# Patient Record
Sex: Female | Born: 1980 | Race: Black or African American | Hispanic: No | Marital: Married
Health system: Southern US, Community
[De-identification: ages and names within clinical notes are randomized; demographics above are authoritative.]

## PROBLEM LIST (undated history)

## (undated) DIAGNOSIS — E785 Hyperlipidemia, unspecified: Secondary | ICD-10-CM

## (undated) DIAGNOSIS — E669 Obesity, unspecified: Secondary | ICD-10-CM

## (undated) DIAGNOSIS — I10 Essential (primary) hypertension: Secondary | ICD-10-CM

## (undated) DIAGNOSIS — K219 Gastro-esophageal reflux disease without esophagitis: Secondary | ICD-10-CM

## (undated) HISTORY — DX: Gastro-esophageal reflux disease without esophagitis: K21.9

## (undated) HISTORY — DX: Obesity, unspecified: E66.9

## (undated) HISTORY — DX: Essential (primary) hypertension: I10

## (undated) HISTORY — DX: Hyperlipidemia, unspecified: E78.5

---

## 2001-06-17 ENCOUNTER — Other Ambulatory Visit: Admission: RE | Admit: 2001-06-17 | Discharge: 2001-06-17 | Payer: Self-pay

## 2001-06-24 ENCOUNTER — Emergency Department (HOSPITAL_COMMUNITY): Admission: EM | Admit: 2001-06-24 | Discharge: 2001-06-24 | Payer: Self-pay | Admitting: Emergency Medicine

## 2001-06-24 ENCOUNTER — Encounter: Payer: Self-pay | Admitting: Emergency Medicine

## 2002-07-02 ENCOUNTER — Other Ambulatory Visit: Admission: RE | Admit: 2002-07-02 | Discharge: 2002-07-02 | Payer: Self-pay

## 2003-07-06 ENCOUNTER — Other Ambulatory Visit: Admission: RE | Admit: 2003-07-06 | Discharge: 2003-07-06 | Payer: Self-pay | Admitting: Obstetrics and Gynecology

## 2004-07-22 ENCOUNTER — Other Ambulatory Visit: Admission: RE | Admit: 2004-07-22 | Discharge: 2004-07-22 | Payer: Self-pay | Admitting: Obstetrics and Gynecology

## 2004-09-09 ENCOUNTER — Encounter: Admission: RE | Admit: 2004-09-09 | Discharge: 2004-09-09 | Payer: Self-pay | Admitting: Gastroenterology

## 2005-07-10 ENCOUNTER — Other Ambulatory Visit: Admission: RE | Admit: 2005-07-10 | Discharge: 2005-07-10 | Payer: Self-pay | Admitting: Obstetrics and Gynecology

## 2006-07-11 ENCOUNTER — Other Ambulatory Visit: Admission: RE | Admit: 2006-07-11 | Discharge: 2006-07-11 | Payer: Self-pay | Admitting: Obstetrics and Gynecology

## 2007-07-26 ENCOUNTER — Other Ambulatory Visit: Admission: RE | Admit: 2007-07-26 | Discharge: 2007-07-26 | Payer: Self-pay | Admitting: Obstetrics and Gynecology

## 2008-04-22 ENCOUNTER — Inpatient Hospital Stay (HOSPITAL_COMMUNITY): Admission: AD | Admit: 2008-04-22 | Discharge: 2008-04-25 | Payer: Self-pay | Admitting: Obstetrics and Gynecology

## 2008-04-24 ENCOUNTER — Encounter (INDEPENDENT_AMBULATORY_CARE_PROVIDER_SITE_OTHER): Payer: Self-pay | Admitting: Obstetrics and Gynecology

## 2008-09-14 ENCOUNTER — Ambulatory Visit (HOSPITAL_COMMUNITY): Admission: RE | Admit: 2008-09-14 | Discharge: 2008-09-14 | Payer: Self-pay | Admitting: Obstetrics

## 2008-11-24 ENCOUNTER — Encounter: Admission: RE | Admit: 2008-11-24 | Discharge: 2008-11-24 | Payer: Self-pay | Admitting: Obstetrics

## 2009-02-03 ENCOUNTER — Inpatient Hospital Stay (HOSPITAL_COMMUNITY): Admission: AD | Admit: 2009-02-03 | Discharge: 2009-02-05 | Payer: Self-pay | Admitting: Obstetrics

## 2009-03-18 ENCOUNTER — Inpatient Hospital Stay (HOSPITAL_COMMUNITY): Admission: AD | Admit: 2009-03-18 | Discharge: 2009-03-22 | Payer: Self-pay | Admitting: Obstetrics

## 2010-08-17 LAB — CBC
HCT: 40.3 % (ref 36.0–46.0)
Hemoglobin: 13 g/dL (ref 12.0–15.0)
Hemoglobin: 13.1 g/dL (ref 12.0–15.0)
Hemoglobin: 13.7 g/dL (ref 12.0–15.0)
MCHC: 33.9 g/dL (ref 30.0–36.0)
MCHC: 34.1 g/dL (ref 30.0–36.0)
MCV: 85.3 fL (ref 78.0–100.0)
MCV: 86.2 fL (ref 78.0–100.0)
Platelets: 230 10*3/uL (ref 150–400)
RBC: 4.42 MIL/uL (ref 3.87–5.11)
RBC: 4.5 MIL/uL (ref 3.87–5.11)
RBC: 4.72 MIL/uL (ref 3.87–5.11)
RDW: 13.6 % (ref 11.5–15.5)
RDW: 13.8 % (ref 11.5–15.5)
WBC: 11.6 10*3/uL — ABNORMAL HIGH (ref 4.0–10.5)
WBC: 9.6 10*3/uL (ref 4.0–10.5)

## 2010-08-17 LAB — URIC ACID: Uric Acid, Serum: 3.2 mg/dL (ref 2.4–7.0)

## 2010-08-17 LAB — LACTATE DEHYDROGENASE: LDH: 141 U/L (ref 94–250)

## 2010-08-17 LAB — COMPREHENSIVE METABOLIC PANEL
Albumin: 2.6 g/dL — ABNORMAL LOW (ref 3.5–5.2)
BUN: 1 mg/dL — ABNORMAL LOW (ref 6–23)
Creatinine, Ser: 0.42 mg/dL (ref 0.4–1.2)
Glucose, Bld: 83 mg/dL (ref 70–99)
Total Protein: 5.6 g/dL — ABNORMAL LOW (ref 6.0–8.3)

## 2010-08-17 LAB — RPR: RPR Ser Ql: NONREACTIVE

## 2010-08-23 LAB — TYPE AND SCREEN
ABO/RH(D): O POS
Antibody Screen: NEGATIVE

## 2010-08-23 LAB — URINE CULTURE
Colony Count: NO GROWTH
Special Requests: NEGATIVE

## 2010-08-24 LAB — CBC
HCT: 42.7 % (ref 36.0–46.0)
MCV: 84.9 fL (ref 78.0–100.0)
Platelets: 262 10*3/uL (ref 150–400)
RDW: 14.5 % (ref 11.5–15.5)
WBC: 7.3 10*3/uL (ref 4.0–10.5)

## 2010-08-24 LAB — URINALYSIS, ROUTINE W REFLEX MICROSCOPIC
Bilirubin Urine: NEGATIVE
Glucose, UA: NEGATIVE mg/dL
Hgb urine dipstick: NEGATIVE
Ketones, ur: NEGATIVE mg/dL
Nitrite: NEGATIVE
Protein, ur: NEGATIVE mg/dL
Specific Gravity, Urine: 1.01 (ref 1.005–1.030)
Urobilinogen, UA: 0.2 mg/dL (ref 0.0–1.0)
pH: 7 (ref 5.0–8.0)

## 2010-09-25 IMAGING — US US OB COMP +14 WK
1 series · 14 of 28 positions shown · non-contrast
Comparison: none

OBSTETRICAL ULTRASOUND:
 This ultrasound exam was performed in the [HOSPITAL] Ultrasound Department.  The OB US report was generated in the AS system, and faxed to the ordering physician.  This report is also available in [REDACTED] PACS.

[Series 1: us ob comp +14 wk · 0.31mm/px · 39 acquisitions, 14 frames shown]
[im 2/39]
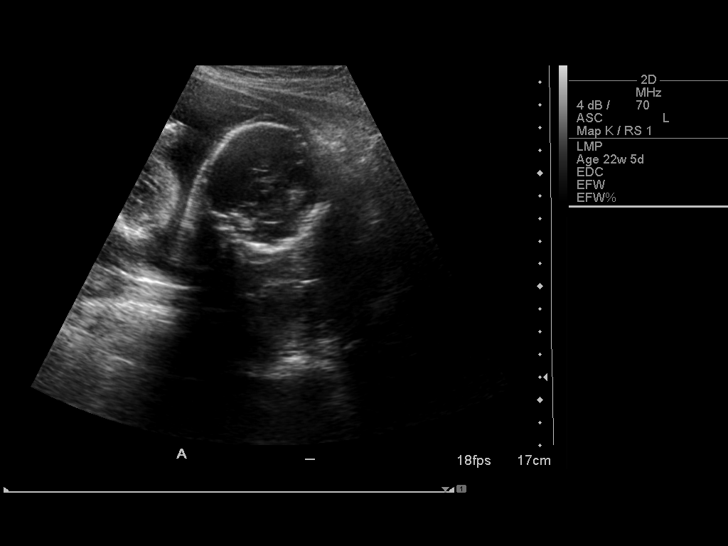
[im 5/39]
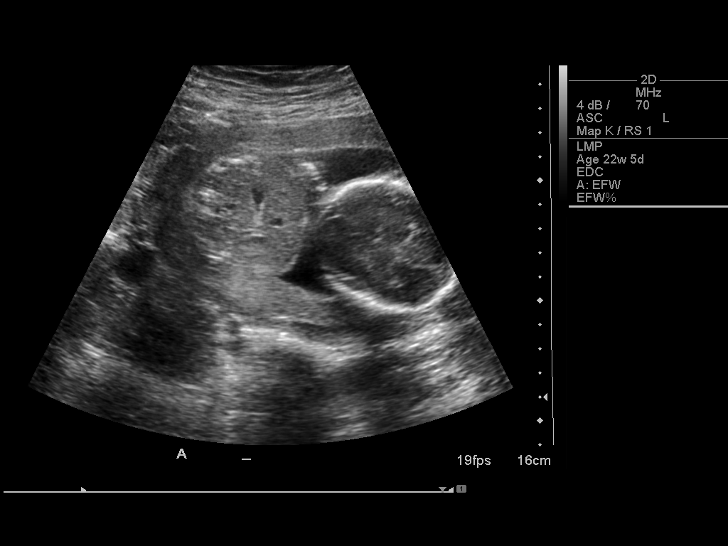
[im 8/39]
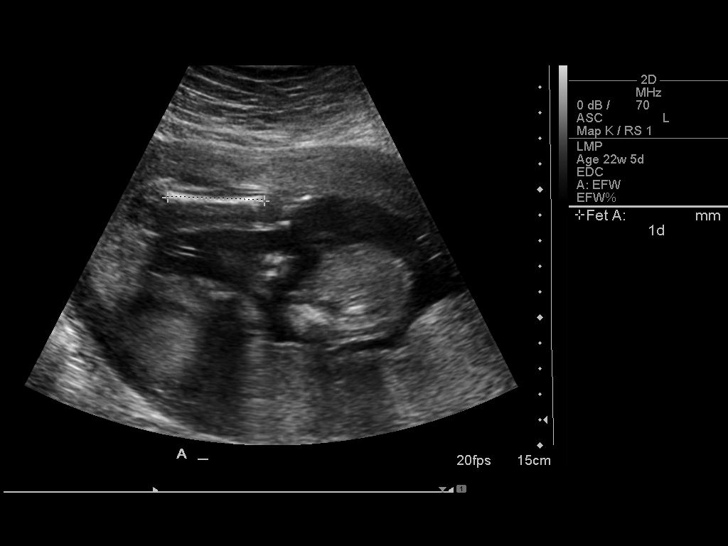
[im 10/39]
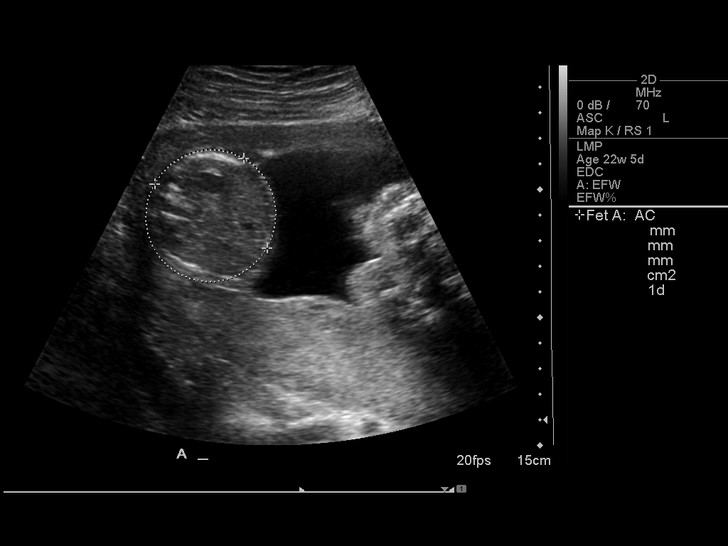
[im 13/39]
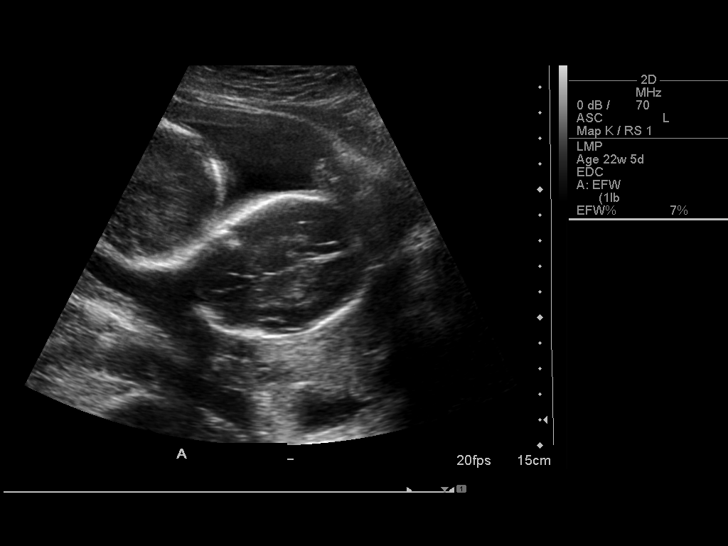
[im 16/39]
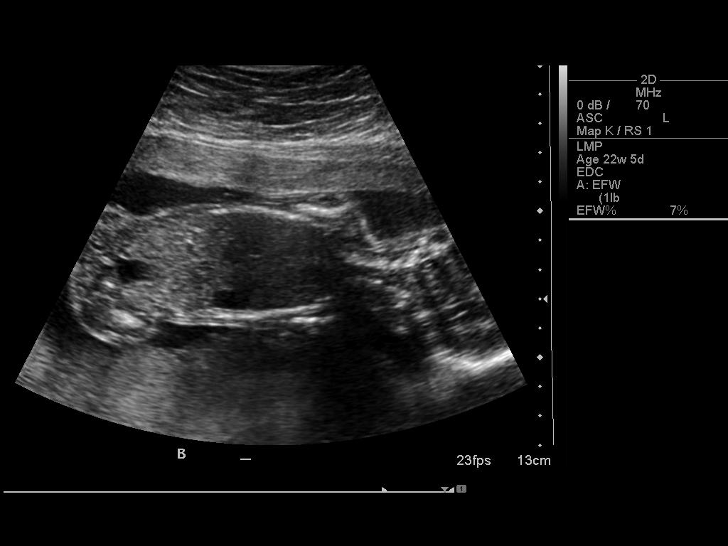
[im 19/39]
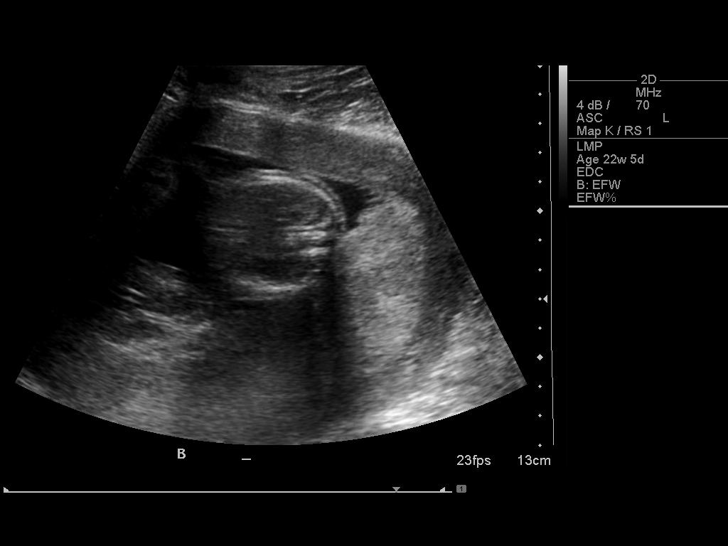
[im 22/39]
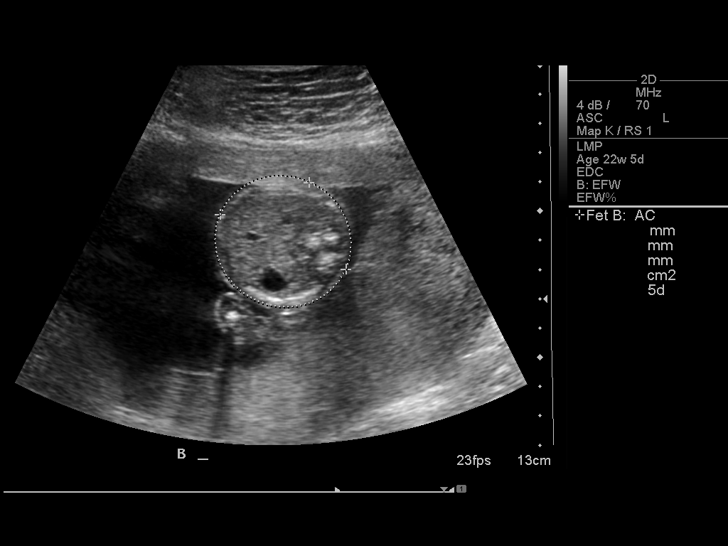
[im 24/39]
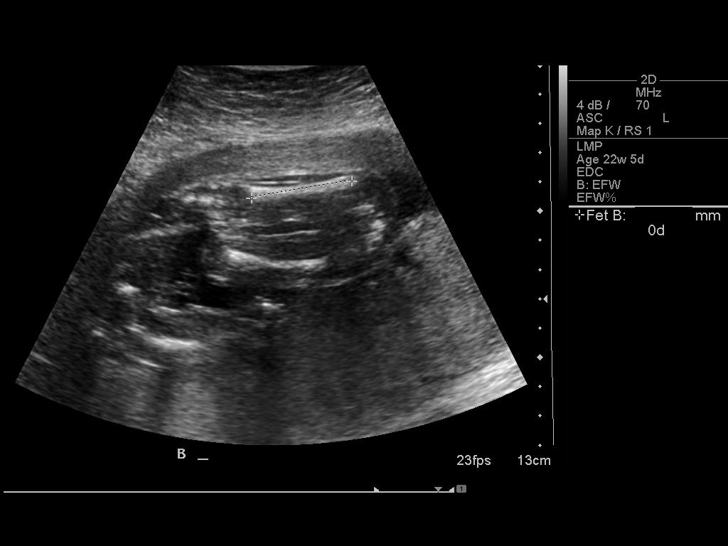
[im 27/39]
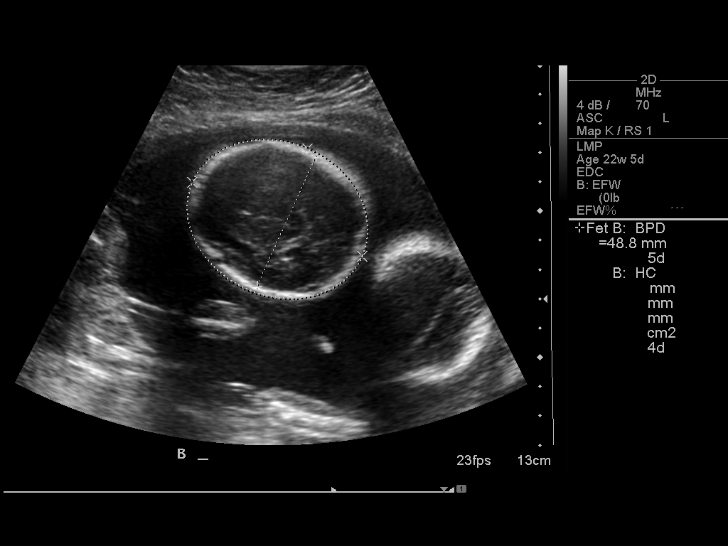
[im 30/39]
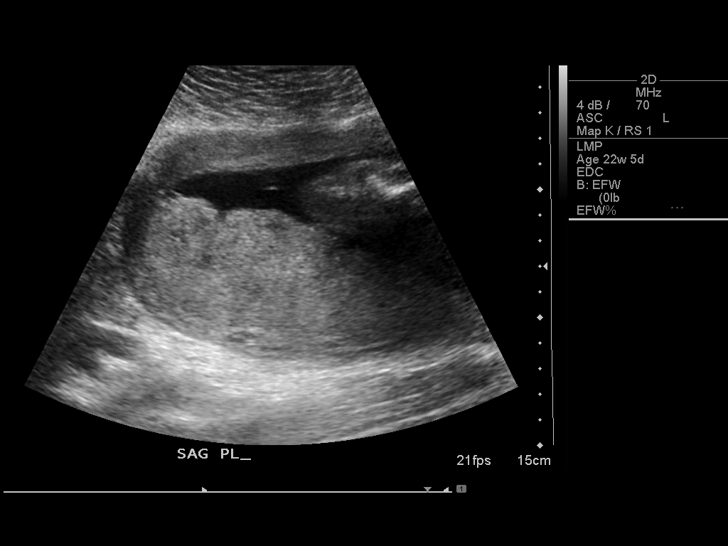
[im 33/39]
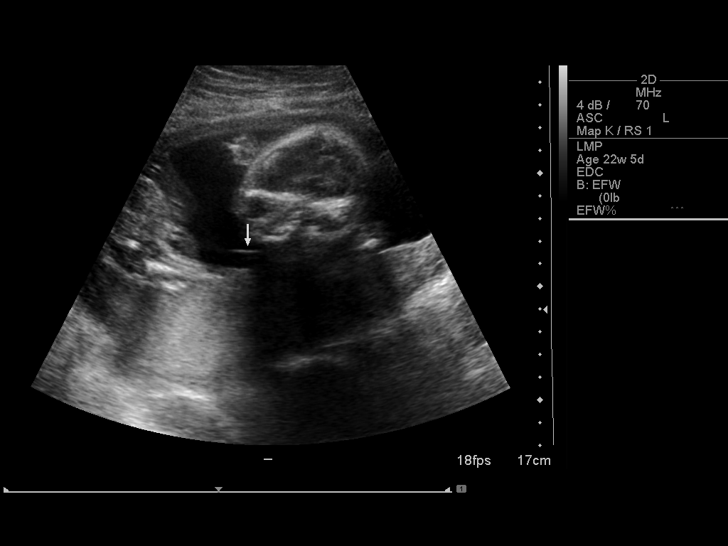
[im 36/39]
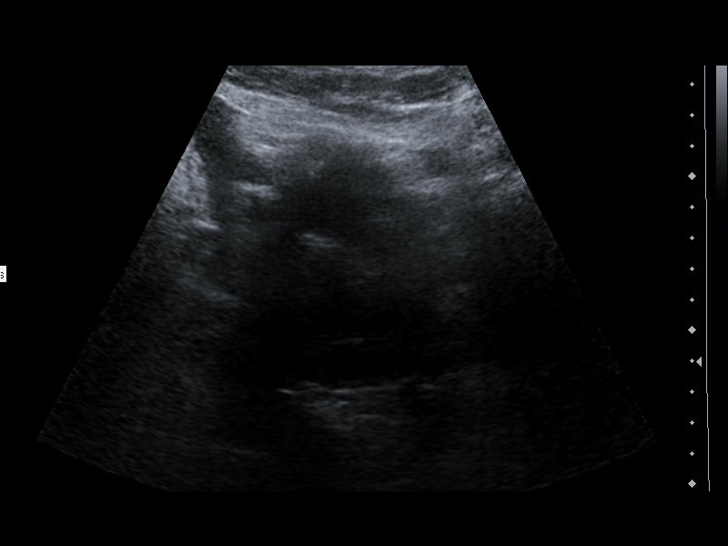
[im 39/39]
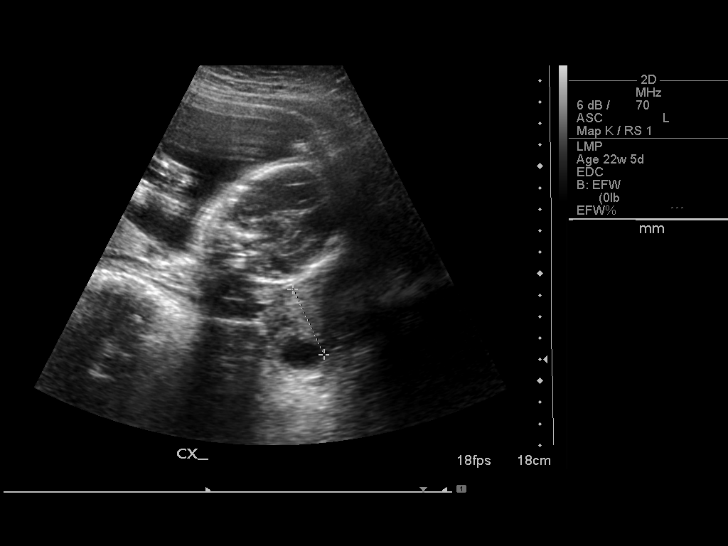

[14 of 28 positions shown; findings below may reference images not displayed]

IMPRESSION: See AS Obstetric US report.

## 2010-09-27 NOTE — Op Note (Signed)
Jacqueline Cuevas, Jacqueline Cuevas            ACCOUNT NO.:  [id number]   MEDICAL RECORD NO.:  [id number]          PATIENT TYPE:  AMB   LOCATION:  SDC                           FACILITY:  [facility]   PHYSICIAN:  Lendon Colonel, MD   DATE OF BIRTH:  04-Nov-1980   DATE OF PROCEDURE:  09/14/2008  DATE OF DISCHARGE:                               OPERATIVE REPORT   PREOPERATIVE DIAGNOSIS:  Cervical incompetence.   POSTOPERATIVE DIAGNOSIS:  Cervical incompetence.   PROCEDURE:  History indicated cervical cerclage with Mersilene.   SURGEON:  Alphonsus Sias. Ernestina Penna, MD   ASSISTANT:  None.   ANESTHESIA:  Spinal.   FINDINGS:  Long cervix preprocedure.  Good cerclage placement  postprocedure with knot at 12 o'clock, hemostasis.   SPECIMENS:  None.   ANTIBIOTICS:  None.   ESTIMATED BLOOD LOSS:  Minimal.   COMPLICATIONS:  None.   INDICATIONS:  This 30 year old, G1, P0 with history of 22-week twin  fetal demise secondary to PPROM and possible cervical incompetence.  After risks, benefits, alternatives of cerclage were discussed with the  patient, decision was made to proceed with history indicated  prophylactic cerclage.  The patient had ultrasound in the office earlier  today which noted a viable IUP without any major malformations.  Genetic  screening was deferred.  The patient was thus consented for a cerclage.   PROCEDURE:  After informed consent was obtained, the patient was taken  to the operating room where spinal anesthesia was initiated without  difficulty.  She was prepped and draped in a sterile fashion in dorsal  supine lithotomy position.  Bimanual examination was done which felt a  long cervix.  A long speculum was inserted into the vagina.  Cervix was  identified.  A long weighted speculum was then inserted after a bivalve  speculum was removed, and a Deaver was used to retract the anterior  vaginal wall.  A Mayo catgut half-inch circle taper needle was used with  a 12-inch 5-mm Mersilene  mesh tape.  The mesh tape was threaded through  the mayo needle and starting at 12 o'clock and proceeding with sutures  from 12 to 9, 9 to 6, 6 to 3, and back 3 to 12.  Four sutures were taken  in a pursestring fashion.  Great care was taken with each stitch.  A 0  Prolene was then tied down behind the tie and the Mersilene mesh suture  in order to provide retraction on the Mersilene tape for removal.  The  Mersilene stitch was placed approximately 2 cm into the cervix on the  anterior cervical wall and about 1-1/2 cm in on the  posterior.  The posterior bite was a bit more shallow given the  posterior partial previa.  The knot was tied at 12 o'clock.  Good  hemostasis was noted.  The patient tolerated the procedure well.  Sponge, lap, and needle count were correct x3, and the patient was taken  to the recovery room in stable condition.      Lendon Colonel, MD  Electronically Signed     KAF/MEDQ  D:  09/14/2008  T:  09/15/2008  Job:  [id number]

## 2010-09-27 NOTE — Discharge Summary (Signed)
Jacqueline Cuevas, Jacqueline Cuevas            ACCOUNT NO.:  [id number]   MEDICAL RECORD NO.:  [id number]          PATIENT TYPE:  INP   LOCATION:  9302                          FACILITY:  [facility]   PHYSICIAN:  Gerald Leitz, MD          DATE OF BIRTH:  October 18, 1980   DATE OF ADMISSION:  04/22/2008  DATE OF DISCHARGE:  04/25/2008                               DISCHARGE SUMMARY   ADMISSION DIAGNOSES:  1. A 22-5/7th weeks intrauterine pregnancy.  2. Twin gestation.  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  4. Preterm premature rupture of membranes.   DISCHARGE DIAGNOSES:  1. A 22-5/7th weeks intrauterine pregnancy.  2. Twin gestation.  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  4. Preterm premature rupture of membranes.  5. Status post spontaneous vaginal delivery.   BRIEF HOSPITAL COURSE:  The patient was admitted on April 25, 2008,  at 22 weeks and 5 days intrauterine pregnancy with twin-to-twin  transfusion syndrome.  She had preterm premature rupture of membranes of  twin #A on April 22, 2008 and was admitted.  She was counseled  regarding expected management versus augmentation of labor with the  delivery of 2 nonviable infant.  A NICU consult was obtained.  After  counseling the patient, decided to move towards delivery and received  Cytotec and Pitocin.  She delivered on April 24, 2008, a live born  female infant with Apgars of 1 and 1 at 1 and 5 minutes respectively.  Twin A baby was delivered at 6:12 in the morning and twin B was  delivered at 6:21 female infant with Apgars of 1 and 1 at 1 and 5  minutes respectively.  She did well postoperatively and is discharged to  home in stable condition.  Hemoglobin on postop day #1 was 10.1.   MEDICATIONS AT DISCHARGE:  Toprol, Prilosec, Xanax, Motrin, and  Phenergan.  Follow up with Dr. Noel Gerold in 4-6 weeks for postpartum visit.   CONDITION AT DISCHARGE:  Stable.      Gerald Leitz, MD  Electronically Signed     TC/MEDQ  D:  04/25/2008  T:   04/25/2008  Job:  [id number]

## 2011-02-17 LAB — CBC
HCT: 29.8 % — ABNORMAL LOW (ref 36.0–46.0)
HCT: 36.2 % (ref 36.0–46.0)
Hemoglobin: 12.6 g/dL (ref 12.0–15.0)
MCHC: 34.6 g/dL (ref 30.0–36.0)
MCV: 85.8 fL (ref 78.0–100.0)
Platelets: 210 10*3/uL (ref 150–400)
Platelets: 255 K/uL (ref 150–400)
RBC: 3.44 MIL/uL — ABNORMAL LOW (ref 3.87–5.11)
RBC: 4.22 MIL/uL (ref 3.87–5.11)
RDW: 13.1 % (ref 11.5–15.5)
WBC: 8.4 10*3/uL (ref 4.0–10.5)
WBC: 9.1 K/uL (ref 4.0–10.5)

## 2011-02-17 LAB — URINALYSIS, DIPSTICK ONLY
Ketones, ur: NEGATIVE mg/dL
Leukocytes, UA: NEGATIVE
Nitrite: NEGATIVE
Protein, ur: NEGATIVE mg/dL
Urobilinogen, UA: 0.2 mg/dL (ref 0.0–1.0)

## 2011-02-17 LAB — TYPE AND SCREEN
ABO/RH(D): O POS
Antibody Screen: NEGATIVE

## 2011-02-17 LAB — ABO/RH: ABO/RH(D): O POS

## 2011-02-17 LAB — STREP B DNA PROBE: Strep Group B Ag: POSITIVE

## 2011-02-17 LAB — GC/CHLAMYDIA PROBE AMP, GENITAL: Chlamydia, DNA Probe: NEGATIVE

## 2012-11-25 ENCOUNTER — Ambulatory Visit: Payer: Self-pay | Admitting: Dietician

## 2012-12-10 ENCOUNTER — Encounter: Payer: Self-pay | Admitting: Dietician

## 2012-12-10 ENCOUNTER — Encounter: Payer: 59 | Attending: Obstetrics | Admitting: Dietician

## 2012-12-10 VITALS — Ht 62.5 in | Wt 252.3 lb

## 2012-12-10 DIAGNOSIS — E669 Obesity, unspecified: Secondary | ICD-10-CM | POA: Insufficient documentation

## 2012-12-10 DIAGNOSIS — Z713 Dietary counseling and surveillance: Secondary | ICD-10-CM | POA: Insufficient documentation

## 2012-12-10 NOTE — Patient Instructions (Addendum)
Create some time on the weekend to plan meals for the week. Make time to eat 3 meals per day and 2 snacks if needed. Eat in proportion to MyPlate, paying attention to hunger and fullness cues. Consider finding something fun and relaxing to do.

## 2012-12-10 NOTE — Progress Notes (Signed)
  Medical Nutrition Therapy:  Appt start time: 1500 end time:  1600.  Assessment:  Primary concerns today: Interested in losing weight, and doctor said that cholesterol was elevated.   Jacqueline Cuevas works for [employer] at home and lives with husband and son. States that most family members were "chunky". Weighed 130-134 pounds in high school and college. Started gaining weight after getting married and gained substantial weight with pregnancies and unable to lose weight since then.  Has a history of PCOS.   Previously lost weight on [employer] and [place]. Currently not eating breakfast and sometimes will skip lunch because she is very busy with work and taking care of 34 year old son. Jacqueline Cuevas works from home and cares for her son at the same time. Currently going to gym in the evening about 3 x week and has been regularly exercising for a number of years. Tracks her food intake and calories on iPad. States that she is not feeling hunger at all at this time, despite eating very little most days.   Lost 10 pounds in past 6 weeks due to stress from relationship with husband. Did not eat for several days last week due to stress with husband. Was not interested in recommendations for therapists at this time.   Brought 23 year old son to appointment with seem to add stress and made it difficult to concentrate.   MEDICATIONS: see list   DIETARY INTAKE:   24-hr recall:  B ( AM): coffee with creamer, water Snk ( AM): none  L ( 12 PM): Maybe an egg or piece of fruit with water sometimes will skip Snk ( PM): none D ( 6:30-7:30PM): caprese salad with grilled chicken with water or diet pepsi Snk ( PM): sometimes ice cream, twizzlers, diet pepsi Beverages: water mostly  Usual physical activity: goes to [employer] 3 x week and doing cardio, Zumba, or yoga for one hour  Estimated energy needs: 1800 calories 200 g carbohydrates 135 g protein 50 g fat  Progress Towards Goal(s):  In  progress.   Nutritional Diagnosis:  NI-1.4 Inadequate energy intake As related to eating only one meal per day.  As evidenced by diet recall.    Intervention:  Nutrition counseling provided. Emphasized the importance of eating enough food, 3 meals per day and 2 snacks, to prevent undue stress which inhibits weight loss. Suggested finding time to plan and shop for food on the weekend so that she can easily grab food for herself during the day. Also suggested making time to eat during the day and consider finding someone to babysit once in a while so she can do something relaxing. Recommended seeking the help of a therapist to deal with stress.   Plan: Create some time on the weekend to plan meals for the week.  Make time to eat 3 meals per day and 2 snacks if needed.  Eat in proportion to MyPlate, paying attention to hunger and fullness cues.  Consider finding something fun and relaxing to do.   Handouts given during visit include:  MyPlate  Snack Handout  Monitoring/Evaluation:  Dietary intake, exercise, and body weight in 4 week(s).

## 2013-01-06 ENCOUNTER — Encounter: Payer: 59 | Attending: Obstetrics | Admitting: Dietician

## 2013-01-06 VITALS — Ht 62.5 in | Wt 249.4 lb

## 2013-01-06 DIAGNOSIS — E669 Obesity, unspecified: Secondary | ICD-10-CM

## 2013-01-06 DIAGNOSIS — Z713 Dietary counseling and surveillance: Secondary | ICD-10-CM | POA: Insufficient documentation

## 2013-01-06 NOTE — Patient Instructions (Addendum)
Continue to plan healthy meals throughout the week. Continue eating 3 meals per day and 2-3 snacks if needed. Continue to eat in proportion to MyPlate, paying attention to hunger and fullness cues. Continue finding fun and relaxing things to do. Continue adding more physical activity into your week if possible.  Consider planning some small food splurges (like ice cream) every once in a while.Marland KitchenMarland Kitchen

## 2013-01-06 NOTE — Progress Notes (Signed)
  Medical Nutrition Therapy:  Appt start time: 1500 end time:  1530.  Assessment:  Primary concerns today: Jacqueline Cuevas is here today for a follow up for weight loss.  She has lost about 3 pounds in the past 4 weeks and has made a lot of positive changes to her eating habits and lifestyles. Reports that she is no longer skipping meals, added snacks into her day, and stopped buying [employer] to eat at night. States that she is feeling hunger again now that she is eating regularly. States that she is trying to reduce stress by hanging out with friends and went to the beach for a weekend.    Wt Readings from Last 3 Encounters:  01/06/13 249 lb 6.4 oz (113.127 kg)  12/10/12 252 lb 4.8 oz (114.443 kg)   Ht Readings from Last 3 Encounters:  01/06/13 5' 2.5" (1.588 m)  12/10/12 5' 2.5" (1.588 m)   Body mass index is 44.86 kg/(m^2). @BMIFA @ Normalized weight-for-age data available only for age 70 to 20 years. Normalized stature-for-age data available only for age 70 to 20 years.   MEDICATIONS: see list   DIETARY INTAKE:   24-hr recall:  B ( AM): hard boiled egg and half bagel or omelette with spinach coffee with creamer, water Snk ( AM): none  L ( 12 PM): lean meat and greens with water Snk ( PM): 15 g CHO cheeto snack pack D ( 6:30-7:30PM): whole grain pasta or rice, greens, salad with grilled chicken with water Snk ( PM): sometimes ice cream, twizzlers, diet pepsi Beverages: water mostly, sometimes diet pepsi  Usual physical activity: goes to [employer] 3 x week and doing cardio, Zumba, or yoga for one hour and walking at least 30 minutes 4 x week  Estimated energy needs: 1800 calories 200 g carbohydrates 135 g protein 50 g fat  Progress Towards Goal(s):  Some progress.   Nutritional Diagnosis:  NI-1.4 Inadequate energy intake As related to eating only one meal per day.  As evidenced by diet recall.    Intervention:  Nutrition counseling provided. Encouraged her to continue with  all of the positive changes she has made. Advised her to plan for and allow for small splurges to avoid letting foods like ice cream have too much power over her.   Plan: Continue to plan healthy meals throughout the week. Continue eating 3 meals per day and 2-3 snacks if needed. Continue to eat in proportion to MyPlate, paying attention to hunger and fullness cues. Continue finding fun and relaxing things to do. Continue adding more physical activity into your week if possible.  Consider planning some small food splurges (like ice cream) every once in a while...   Monitoring/Evaluation:  Dietary intake, exercise, and body weight in 2 month(s).

## 2013-03-10 ENCOUNTER — Ambulatory Visit: Payer: 59 | Admitting: *Deleted

## 2013-03-18 ENCOUNTER — Encounter: Payer: 59 | Attending: Obstetrics | Admitting: *Deleted

## 2013-03-18 VITALS — Ht 62.5 in | Wt 251.0 lb

## 2013-03-18 DIAGNOSIS — Z713 Dietary counseling and surveillance: Secondary | ICD-10-CM | POA: Insufficient documentation

## 2013-03-18 DIAGNOSIS — E669 Obesity, unspecified: Secondary | ICD-10-CM

## 2013-03-18 NOTE — Progress Notes (Signed)
  Medical Nutrition Therapy:  Appt start time: 1115 end time:  1145.  Assessment:  Primary concerns today: Jacqueline Cuevas is here today for a follow up for weight loss.  She admits to skipping breakfast.  Her appetite is poor due to allergies. She has not been as physically active either  Wt Readings from Last 3 Encounters:  03/18/13 251 lb (113.853 kg)  01/06/13 249 lb 6.4 oz (113.127 kg)  12/10/12 252 lb 4.8 oz (114.443 kg)   Ht Readings from Last 3 Encounters:  03/18/13 5' 2.5" (1.588 m)  01/06/13 5' 2.5" (1.588 m)  12/10/12 5' 2.5" (1.588 m)   Body mass index is 45.15 kg/(m^2). @BMIFA @ Normalized weight-for-age data available only for age 40 to 20 years. Normalized stature-for-age data available only for age 40 to 20 years.   MEDICATIONS: see list   DIETARY INTAKE:  24-hr recall:  B ( AM): 1/2 muffin and fruits.  Sometimes egg and fruit Snk ( AM): none  L ( 12 PM): tuna and whole wheat bread with a pickle Snk ( PM): fiber one bar D ( 6:30-7:30PM): pasta with meat or chicken and broccoli or cauliflour Snk ( PM): not usually Beverages: water mostly, 1 cup coffee.  Diet pepsi  Usual physical activity: was not able to be as physically active this month.works out 2 times a week.  She wants to get up in her workouts.   Estimated energy needs: 1800 calories 200 g carbohydrates 135 g protein 50 g fat  Progress Towards Goal(s):  Some progress.   Nutritional Diagnosis:  NI-1.4 Inadequate energy intake As related to eating only one meal per day.  As evidenced by diet recall.    Intervention:  Nutrition counseling provided. Encouraged her to continue with all of the positive changes she has made. Reminded her to eat 3 meals a day and to avoid meal skipping.  Suggested [employer] if she has no appetite for food.  Plan: Continue to plan healthy meals throughout the week. Continue eating 3 meals per day and 2-3 snacks if needed. Continue to eat in proportion to  MyPlate, paying attention to hunger and fullness cues. Continue finding fun and relaxing things to do. Continue adding more physical activity into your week if possible.  Consider planning some small food splurges (like ice cream) every once in a while...   Monitoring/Evaluation:  Dietary intake, exercise, and body weight in 1 month(s).

## 2013-03-18 NOTE — Patient Instructions (Signed)
Try carnation breakfast essentials mixed with almond milk for breakfast

## 2013-04-16 ENCOUNTER — Ambulatory Visit: Payer: 59 | Admitting: Dietician

## 2013-05-28 ENCOUNTER — Ambulatory Visit: Payer: 59 | Admitting: Dietician

## 2013-05-30 ENCOUNTER — Encounter: Payer: 59 | Attending: Obstetrics | Admitting: Dietician

## 2013-05-30 VITALS — Ht 62.5 in | Wt 244.5 lb

## 2013-05-30 DIAGNOSIS — Z6841 Body Mass Index (BMI) 40.0 and over, adult: Secondary | ICD-10-CM | POA: Insufficient documentation

## 2013-05-30 DIAGNOSIS — E669 Obesity, unspecified: Secondary | ICD-10-CM | POA: Insufficient documentation

## 2013-05-30 DIAGNOSIS — Z713 Dietary counseling and surveillance: Secondary | ICD-10-CM | POA: Insufficient documentation

## 2013-05-30 NOTE — Progress Notes (Signed)
  Medical Nutrition Therapy:  Appt start time: 1030 end time:  1045.  Assessment:  Primary concerns today: Jacqueline Cuevas is here today for a follow up for weight loss. Lost 7 lbs since her last visit. Tried [employer] Instant Breakfast and did not like it. Started going back to the gym (missed a few weeks d/t the holiday). Eating breakfast everyday.   Wt Readings from Last 3 Encounters:  05/30/13 244 lb 8 oz (110.904 kg)  03/18/13 251 lb (113.853 kg)  01/06/13 249 lb 6.4 oz (113.127 kg)   Ht Readings from Last 3 Encounters:  05/30/13 5' 2.5" (1.588 m)  03/18/13 5' 2.5" (1.588 m)  01/06/13 5' 2.5" (1.588 m)   Body mass index is 43.98 kg/(m^2). @BMIFA @ Normalized weight-for-age data available only for age 65 to 20 years. Normalized stature-for-age data available only for age 65 to 20 years.  MEDICATIONS: see list   DIETARY INTAKE:  24-hr recall:  B ( AM): bagel with egg and pineapple Snk ( AM): none  L ( 12 PM): broccoli, salad, light cheese, pasta Snk ( PM): none D (6:30-7:30PM): pizza, baked potato with brocolli  Snk ( PM): not usually, but had a king size snickers Beverages: water mostly, 1 cup coffee.  Diet pepsi  Usual physical activity: going to the gym 2-3 x week for 45-60 minutes  Estimated energy needs: 1800 calories 200 g carbohydrates 135 g protein 50 g fat  Progress Towards Goal(s):  Some progress.   Nutritional Diagnosis:  NI-1.4 Inadequate energy intake As related to eating only one meal per day.  As evidenced by diet recall.    Intervention:  Nutrition counseling provided. Encouraged her to continue with all of the positive changes she has made.  Plan: Continue to plan healthy meals throughout the week. Continue eating 3 meals per day and 2-3 snacks if needed. Continue to eat in proportion to MyPlate, paying attention to hunger and fullness cues. Continue finding fun and relaxing things to do. (Yoga!) Continue adding more physical activity into your week if  possible.  Consider planning some small food splurges (like ice cream) every once in a while...   Monitoring/Evaluation:  Dietary intake, exercise, and body weight in 3 month(s).

## 2013-05-30 NOTE — Patient Instructions (Addendum)
Continue to plan healthy meals throughout the week. Continue eating 3 meals per day and 2-3 snacks if needed. Continue to eat in proportion to MyPlate, paying attention to hunger and fullness cues. Continue finding fun and relaxing things to do. (Yoga!) Continue adding more physical activity into your week if possible.  Consider planning some small food splurges (like ice cream) every once in a while...   

## 2013-08-25 ENCOUNTER — Ambulatory Visit: Payer: 59 | Admitting: Dietician

## 2013-10-08 ENCOUNTER — Ambulatory Visit: Payer: 59 | Admitting: Dietician

## 2013-10-22 ENCOUNTER — Encounter: Payer: 59 | Attending: Obstetrics | Admitting: Dietician

## 2013-10-22 VITALS — Ht 62.5 in | Wt 245.0 lb

## 2013-10-22 DIAGNOSIS — E669 Obesity, unspecified: Secondary | ICD-10-CM | POA: Insufficient documentation

## 2013-10-22 DIAGNOSIS — Z713 Dietary counseling and surveillance: Secondary | ICD-10-CM | POA: Insufficient documentation

## 2013-10-22 NOTE — Progress Notes (Signed)
  Medical Nutrition Therapy:  Appt start time: 1045 end time:  1100.  Assessment:  Primary concerns today: Jacqueline Cuevas is here today for a follow up for weight loss. Weight is stable since last visit. States that food and exercise is no different than last time.   Wt Readings from Last 3 Encounters:  10/22/13 245 lb (111.131 kg)  05/30/13 244 lb 8 oz (110.904 kg)  03/18/13 251 lb (113.853 kg)   Ht Readings from Last 3 Encounters:  10/22/13 5' 2.5" (1.588 m)  05/30/13 5' 2.5" (1.588 m)  03/18/13 5' 2.5" (1.588 m)   Body mass index is 44.07 kg/(m^2). @BMIFA @ Normalized weight-for-age data available only for age 44 to 20 years. Normalized stature-for-age data available only for age 44 to 20 years.   MEDICATIONS: see list   DIETARY INTAKE:  24-hr recall:  B ( AM): bagel with egg and pineapple Snk ( AM): none  L ( 12 PM): broccoli, salad, light cheese, pasta Snk ( PM): none D (6:30-7:30PM): pizza, baked potato with brocolli  Snk ( PM): not usually, but had a king size snickers Beverages: water mostly, 1 cup coffee.  Diet pepsi  Usual physical activity: going to the gym 2-3 x week for 45-60 minutes  Estimated energy needs: 1800 calories 200 g carbohydrates 135 g protein 50 g fat  Progress Towards Goal(s):  Some progress.   Nutritional Diagnosis:  NI-1.4 Inadequate energy intake As related to eating only one meal per day.  As evidenced by diet recall.    Intervention:  Nutrition counseling provided. Encouraged her to continue with all of the positive changes she has made.  Plan: Continue to plan healthy meals throughout the week. Continue eating 3 meals per day and 2-3 snacks if needed. Continue to eat in proportion to MyPlate, paying attention to hunger and fullness cues. Continue finding fun and relaxing things to do. (Yoga!) Continue adding more physical activity into your week if possible.  Consider planning some small food splurges (like ice cream) every once in a  while...   Monitoring/Evaluation:  Dietary intake, exercise, and body weight in 2 month(s).

## 2013-10-22 NOTE — Patient Instructions (Signed)
Continue to plan healthy meals throughout the week. Continue eating 3 meals per day and 2-3 snacks if needed. Continue to eat in proportion to MyPlate, paying attention to hunger and fullness cues. Continue finding fun and relaxing things to do. (Yoga!) Continue adding more physical activity into your week if possible.  Consider planning some small food splurges (like ice cream) every once in a while.Marland KitchenMarland Kitchen

## 2013-12-24 ENCOUNTER — Ambulatory Visit: Payer: 59 | Admitting: Dietician

## 2024-05-22 ENCOUNTER — Other Ambulatory Visit: Payer: Self-pay

## 2024-05-22 ENCOUNTER — Encounter: Payer: Self-pay | Admitting: Allergy

## 2024-05-22 ENCOUNTER — Ambulatory Visit: Admitting: Allergy

## 2024-05-22 VITALS — BP 136/78 | HR 82 | Temp 97.2°F | Ht 61.81 in | Wt 229.9 lb

## 2024-05-22 DIAGNOSIS — L2089 Other atopic dermatitis: Secondary | ICD-10-CM | POA: Diagnosis not present

## 2024-05-22 DIAGNOSIS — H1013 Acute atopic conjunctivitis, bilateral: Secondary | ICD-10-CM | POA: Diagnosis not present

## 2024-05-22 DIAGNOSIS — H109 Unspecified conjunctivitis: Secondary | ICD-10-CM

## 2024-05-22 DIAGNOSIS — J0181 Other acute recurrent sinusitis: Secondary | ICD-10-CM | POA: Diagnosis not present

## 2024-05-22 DIAGNOSIS — J310 Chronic rhinitis: Secondary | ICD-10-CM

## 2024-05-22 MED ORDER — IPRATROPIUM BROMIDE 0.06 % NA SOLN: NASAL | 3 refills | Status: AC

## 2024-05-22 MED ORDER — MONTELUKAST SODIUM 10 MG PO TABS: 10.0000 mg | ORAL_TABLET | Freq: Every day | ORAL | 3 refills | Status: AC

## 2024-05-22 MED ORDER — CARBINOXAMINE MALEATE 4 MG PO TABS: 8.0000 mg | ORAL_TABLET | Freq: Two times a day (BID) | ORAL | 3 refills | Status: AC | PRN

## 2024-05-22 NOTE — Progress Notes (Signed)
 "   New Patient Note  RE: Jacqueline Cuevas MRN: [id number] DOB: 1981-02-07 Date of Office Visit: 05/22/2024  Primary care provider: Chrystal Lamarr GORMAN, MD  Chief Complaint: allergies  History of present illness: Jacqueline Cuevas is a 44 y.o. female presenting today for evaluation of allergic rhinitis. Discussed the use of AI scribe software for clinical note transcription with the patient, who gave verbal consent to proceed.  She has been experiencing persistent allergy symptoms since March, with year-round presence and exacerbations during ragweed season. Symptoms include itchy, watery eyes, stuffy and runny nose, occasional coughing, sneezing, ear itch, ear pain, and aching gums. She also experiences sinus infections approximately three to four times a year, typically resolved with a single course of antibiotics.  Her current management includes Singulair  taken in the morning and azelastine nasal spray once daily. She also uses Benadryl and Mucinex when symptoms worsen. Over-the-counter antihistamines like Claritin, Zyrtec, Allegra, and Xyzal have been ineffective, often requiring multiple doses for minimal relief.  Her symptoms significantly impact her quality of life, causing fatigue and a general feeling of being unwell. She experiences mucus production, described as 'throwing up all of this mucus,' and notes 'slick' bowel movements. She also mentions dark circles under her eyes and red spots on her face.  Her family history includes relatives who have benefited from allergy shots.   She has a history of eczema, which flares up occasionally and is managed with cortisone cream.   Review of systems: 10pt ROS negative unless noted above in HPI  Past medical history: Past Medical History:  Diagnosis Date   GERD (gastroesophageal reflux disease)    Hyperlipidemia    Hypertension    Obesity     Past surgical history: Past Surgical History:  Procedure Laterality Date    CESAREAN SECTION      Family history:  Family History  Problem Relation Age of Onset   Allergic rhinitis Father    Hypertension Other     Social history: Lives in a condo with carpeting with electric heating and central cooling.  Dog and cat in the home.  No concern for water damage, mildew or roaches in the home.  She is a clinical cytogeneticist.  Denies smoking history.   Medication List: Current Outpatient Medications  Medication Sig Dispense Refill   ALPRAZolam (XANAX) 0.5 MG tablet Take 0.5 mg by mouth at bedtime as needed for sleep.     azelastine (ASTELIN) 0.1 % nasal spray Place 1 spray into both nostrils 2 (two) times daily.     Carbinoxamine  Maleate 4 MG TABS Take 2 tablets (8 mg total) by mouth 2 (two) times daily as needed. 120 tablet 3   esomeprazole (NEXIUM) 40 MG capsule Take 40 mg by mouth daily.     ipratropium (ATROVENT ) 0.06 % nasal spray 2 sprays each nostril up to 3-4 times a day as needed for nasal drainage/congestion. 15 mL 3   levonorgestrel (MIRENA, 52 MG,) 20 MCG/DAY IUD 1 each by Intrauterine route once.     metoprolol succinate (TOPROL-XL) 100 MG 24 hr tablet Take 100 mg by mouth daily. Take with or immediately following a meal.     Multiple Vitamins-Minerals (MULTIVITAMIN WITH MINERALS) tablet Take 1 tablet by mouth daily.     sertraline (ZOLOFT) 100 MG tablet Take 100 mg by mouth daily.     buPROPion (WELLBUTRIN XL) 150 MG 24 hr tablet Take 150 mg by mouth every morning.     latanoprost (XALATAN) 0.005 %  ophthalmic solution Place 1 drop into the right eye at bedtime.     montelukast  (SINGULAIR ) 10 MG tablet Take 1 tablet (10 mg total) by mouth daily. 30 tablet 3   pravastatin (PRAVACHOL) 40 MG tablet Take 40 mg by mouth daily.     vitamin B-12 (CYANOCOBALAMIN) 500 MCG tablet Take 500 mcg by mouth daily. (Patient not taking: Reported on 05/22/2024)     No current facility-administered medications for this visit.    Known medication  allergies: Allergies[1]   Physical examination: Blood pressure 136/78, pulse 82, temperature (!) 97.2 F (36.2 C), temperature source Temporal, height 5' 1.81 (1.57 m), weight 229 lb 14.4 oz (104.3 kg), SpO2 97%.  General: Alert, interactive, in no acute distress. HEENT: PERRLA, TMs pearly gray, turbinates moderately edematous without discharge, post-pharynx non erythematous. Neck: Supple without lymphadenopathy. Lungs: Clear to auscultation without wheezing, rhonchi or rales. {no increased work of breathing. CV: Normal S1, S2 without murmurs. Abdomen: Nondistended, nontender. Skin: Warm and dry, without lesions or rashes. Extremities:  No clubbing, cyanosis or edema. Neuro:   Grossly intact.  Diagnostics/Labs: None today  Assessment and plan:   Allergic rhinitis with conjunctivitis Chronic allergic rhinitis with year-round symptoms, worse during ragweed season.  Current treatment with Singulair  and azelastine nasal spray is insufficient.  Over-the-counter antihistamines like Claritin/Zyrtec/Allegra/xyzal are ineffective.  Discussed potential benefit of allergy shots for long-term management, which would reduce medication needs and improve symptoms over time. Allergy shots involve a buildup phase and a maintenance phase. Skin testing is required to identify specific allergens before starting allergy shots. - Prescribed carbinoxamine  4mg  tabs take 2 tabs twice a day as needed. This is a prescription based antihistamine.   - Continue Singuliar 10mg  daily.  Best taken at bedtime.  - Trial nasal Atrovent  2 sprays each nostril up to 3-4 times a day as needed for nasal drainage/congestion.   This can replace Astelin if more effective for drainage control.  Astelin can also be used in conjunction with Atrovent  if still having drainage as they work very differently for nasal symptom control.  Atrovent  is an anticholinergic medication and NOT a nasal steroid.  - Try to avoid benadryl as much  as possible.  - Scheduled skin testing to identify specific allergens. Hold antihistamines for 3 days prior - Discussed allergy shots as a long-term treatment option.  Recurrent acute sinusitis Recurrent sinus infections 3-4 times per year, typically resolved with antibiotics. Current nasal spray treatment is insufficient for nasal inflammation and sinus pressure. Discussed potential benefit of Atrovent  nasal spray for drainage and congestion control. - Recommended Atrovent  nasal spray for drainage and congestion control.  Atopic dermatitis (eczema) Intermittent eczema flares, currently well-controlled with moisturization and topical steroids. Discussed potential improvement of eczema with allergy shots if driven by environmental allergens. - Continue moisturization and use of topical steroids for eczema flares.  Schedule skin testing visit and hold antihistamines for 3 days prior (Env 1-55)  I appreciate the opportunity to take part in Hajer's care. Please do not hesitate to contact me with questions.  Sincerely,   Danita Brain, MD Allergy/Immunology [facility]     [1]  Allergies Allergen Reactions   Codeine    "

## 2024-05-22 NOTE — Patient Instructions (Addendum)
 Allergic rhinitis with conjunctivitis Chronic allergic rhinitis with year-round symptoms, worse during ragweed season.  Current treatment with Singulair  and azelastine nasal spray is insufficient.  Over-the-counter antihistamines like Claritin/Zyrtec/Allegra/xyzal are ineffective.  Discussed potential benefit of allergy shots for long-term management, which would reduce medication needs and improve symptoms over time. Allergy shots involve a buildup phase and a maintenance phase. Skin testing is required to identify specific allergens before starting allergy shots. - Prescribed carbinoxamine  4mg  tabs take 2 tabs twice a day as needed. This is a prescription based antihistamine.   - Continue Singuliar 10mg  daily.  Best taken at bedtime.  - Trial nasal Atrovent  2 sprays each nostril up to 3-4 times a day as needed for nasal drainage/congestion.   This can replace Astelin if more effective for drainage control.  Astelin can also be used in conjunction with Atrovent  if still having drainage as they work very differently for nasal symptom control.  Atrovent  is an anticholinergic medication and NOT a nasal steroid.  - Try to avoid benadryl as much as possible.  - Scheduled skin testing to identify specific allergens. Hold antihistamines for 3 days prior - Discussed allergy shots as a long-term treatment option.  Recurrent acute sinusitis Recurrent sinus infections 3-4 times per year, typically resolved with antibiotics. Current nasal spray treatment is insufficient for nasal inflammation and sinus pressure. Discussed potential benefit of Atrovent  nasal spray for drainage and congestion control. - Recommended Atrovent  nasal spray for drainage and congestion control.  Atopic dermatitis (eczema) Intermittent eczema flares, currently well-controlled with moisturization and topical steroids. Discussed potential improvement of eczema with allergy shots if driven by environmental allergens. - Continue  moisturization and use of topical steroids for eczema flares.  Schedule skin testing visit and hold antihistamines for 3 days prior

## 2024-05-30 ENCOUNTER — Ambulatory Visit: Admitting: Allergy

## 2024-05-30 ENCOUNTER — Encounter: Payer: Self-pay | Admitting: Allergy

## 2024-05-30 DIAGNOSIS — J3089 Other allergic rhinitis: Secondary | ICD-10-CM | POA: Diagnosis not present

## 2024-05-30 DIAGNOSIS — J302 Other seasonal allergic rhinitis: Secondary | ICD-10-CM | POA: Diagnosis not present

## 2024-05-30 DIAGNOSIS — H1013 Acute atopic conjunctivitis, bilateral: Secondary | ICD-10-CM | POA: Diagnosis not present

## 2024-05-30 NOTE — Patient Instructions (Addendum)
 Allergic rhinitis with conjunctivitis Chronic allergic rhinitis with year-round symptoms, worse during ragweed season.   - Use carbinoxamine  4mg  tabs take 2 tabs twice a day as needed. This is a prescription based antihistamine.   - Continue Singuliar 10mg  daily.  Best taken at bedtime.  - Use nasal Atrovent  2 sprays each nostril up to 3-4 times a day as needed for nasal drainage/congestion.   This can replace Astelin if more effective for drainage control.  Astelin can also be used in conjunction with Atrovent  if still having drainage as they work very differently for nasal symptom control.  Atrovent  is an anticholinergic medication and NOT a nasal steroid.  - Try to avoid benadryl as much as possible.   - Testing today showed: grasses, trees, outdoor molds, cockroach, and tobacco - Copy of test results provided.  - Avoidance measures provided. - Consider allergy shots as a means of long-term control. - Allergy shots re-train and reset the immune system to ignore environmental allergens and decrease the resulting immune response to those allergens (sneezing, itchy watery eyes, runny nose, nasal congestion, etc).    - Allergy shots improve symptoms in 75-85% of patients.  - If interested in started allergy shots then let me know and you can schedule a new start appointment.   Recurrent acute sinusitis Recurrent sinus infections 3-4 times per year, typically resolved with antibiotics. Discussed potential benefit of Atrovent  nasal spray for drainage and congestion control. - Recommended Atrovent  nasal spray for drainage and congestion control.  Atopic dermatitis (eczema) Intermittent eczema flares, currently well-controlled with moisturization and topical steroids. Discussed potential improvement of eczema with allergy shots if driven by environmental allergens. - Continue moisturization and use of topical steroids for eczema flares.  Follow-up in 6 months or sooner if needed   Avoidance  measures: Reducing Pollen Exposure  The American Academy of Allergy, Asthma and Immunology suggests the following steps to reduce your exposure to pollen during allergy seasons.    Do not hang sheets or clothing out to dry; pollen may collect on these items. Do not mow lawns or spend time around freshly cut grass; mowing stirs up pollen. Keep windows closed at night.  Keep car windows closed while driving. Minimize morning activities outdoors, a time when pollen counts are usually at their highest. Stay indoors as much as possible when pollen counts or humidity is high and on windy days when pollen tends to remain in the air longer. Use air conditioning when possible.  Many air conditioners have filters that trap the pollen spores. Use a HEPA room air filter to remove pollen form the indoor air you breathe.   Control of Mold Allergen   Mold and fungi can grow on a variety of surfaces provided certain temperature and moisture conditions exist.  Outdoor molds grow on plants, decaying vegetation and soil.  The major outdoor mold, Alternaria and Cladosporium, are found in very high numbers during hot and dry conditions.  Generally, a late Summer - Fall peak is seen for common outdoor fungal spores.  Rain will temporarily lower outdoor mold spore count, but counts rise rapidly when the rainy period ends.  The most important indoor molds are Aspergillus and Penicillium.  Dark, humid and poorly ventilated basements are ideal sites for mold growth.  The next most common sites of mold growth are the bathroom and the kitchen.  Outdoor (Seasonal) Mold Control Use air conditioning and keep windows closed Avoid exposure to decaying vegetation. Avoid leaf raking. Avoid grain handling. Consider  wearing a face mask if working in moldy areas.    Indoor (Perennial) Mold Control  Maintain humidity below 50%. Clean washable surfaces with 5% bleach solution. Remove sources e.g. contaminated  carpets.   Control of Cockroach Allergen  Cockroach allergen has been identified as an important cause of acute attacks of asthma, especially in urban settings.  There are fifty-five species of cockroach that exist in the [place] , however only three, the American, German and Oriental species produce allergen that can affect patients with Asthma.  Allergens can be obtained from fecal particles, egg casings and secretions from cockroaches.    Remove food sources. Reduce access to water. Seal access and entry points. Spray runways with 0.5-1% Diazinon or Chlorpyrifos Blow boric acid power under stoves and refrigerator. Place bait stations (hydramethylnon) at feeding sites.

## 2024-05-30 NOTE — Progress Notes (Signed)
 "   Follow-up Note  RE: Jacqueline Cuevas MRN: [id number] DOB: 06-29-1980 Date of Office Visit: 05/30/2024   History of present illness: Jacqueline Cuevas is a 44 y.o. female presenting today for skin testing visit.  She was last seen in the office on 05/23/23 for rhinoconjunctivitis.  She is in her usual state of health today without recent illness.  She has held antihistamines for at least 3 days for testing today.   Medication List: Current Outpatient Medications  Medication Sig Dispense Refill   ALPRAZolam (XANAX) 0.5 MG tablet Take 0.5 mg by mouth at bedtime as needed for sleep.     azelastine (ASTELIN) 0.1 % nasal spray Place 1 spray into both nostrils 2 (two) times daily.     buPROPion (WELLBUTRIN XL) 150 MG 24 hr tablet Take 150 mg by mouth every morning.     Carbinoxamine  Maleate 4 MG TABS Take 2 tablets (8 mg total) by mouth 2 (two) times daily as needed. 120 tablet 3   esomeprazole (NEXIUM) 40 MG capsule Take 40 mg by mouth daily.     ipratropium (ATROVENT ) 0.06 % nasal spray 2 sprays each nostril up to 3-4 times a day as needed for nasal drainage/congestion. 15 mL 3   latanoprost (XALATAN) 0.005 % ophthalmic solution Place 1 drop into the right eye at bedtime.     levonorgestrel (MIRENA, 52 MG,) 20 MCG/DAY IUD 1 each by Intrauterine route once.     metoprolol succinate (TOPROL-XL) 100 MG 24 hr tablet Take 100 mg by mouth daily. Take with or immediately following a meal.     montelukast  (SINGULAIR ) 10 MG tablet Take 1 tablet (10 mg total) by mouth daily. 30 tablet 3   Multiple Vitamins-Minerals (MULTIVITAMIN WITH MINERALS) tablet Take 1 tablet by mouth daily.     pravastatin (PRAVACHOL) 40 MG tablet Take 40 mg by mouth daily.     sertraline (ZOLOFT) 100 MG tablet Take 100 mg by mouth daily.     vitamin B-12 (CYANOCOBALAMIN) 500 MCG tablet Take 500 mcg by mouth daily. (Patient not taking: Reported on 05/22/2024)     No current facility-administered medications for this visit.      Known medication allergies: Allergies[1]  Diagnostics/Labs:  Allergy testing:   Airborne Adult Perc - 05/30/24 1434     Time Antigen Placed 1434    Allergen Manufacturer Jestine    Location Back    Number of Test 55    1. Control-Buffer 50% Glycerol Negative    2. Control-Histamine 2+    3. Bahia Negative    4. Bermuda Negative    5. Johnson Negative    6. [place]  Blue Negative    7. Meadow Fescue 2+    8. Perennial Rye Negative    9. Timothy Negative    10. Ragweed Mix Negative    11. Cocklebur Negative    12. Plantain,  English Negative    13. Baccharis Negative    14. Dog Fennel Negative    15. Russian Thistle Negative    16. Lamb's Quarters Negative    17. Sheep Sorrell Negative    18. Rough Pigweed Negative    19. Marsh Elder, Rough Negative    21. Box, Elder Negative    22. Cedar, red Negative    23. Sweet Gum Negative    24. Pecan Pollen Negative    25. Pine Mix 2+    26. Walnut, Black Pollen Negative    27. Red Mulberry 2+  28. Ash Mix Negative    29. Birch Mix Negative    30. Beech American Negative    31. Cottonwood, Eastern Negative    32. Hickory, White Negative    33. Maple Mix Negative    34. Oak, Eastern Mix Negative    35. Sycamore Eastern Negative    36. Alternaria Alternata 2+    37. Cladosporium Herbarum Negative    38. Aspergillus Mix Negative    39. Penicillium Mix Negative    40. Bipolaris Sorokiniana (Helminthosporium) Negative    42. Mucor Plumbeus Negative    43. Fusarium Moniliforme Negative    44. Aureobasidium Pullulans (pullulara) Negative    45. Rhizopus Oryzae Negative    46. Botrytis Cinera Negative    47. Epicoccum Nigrum Negative    48. Phoma Betae Negative    49. Dust Mite Mix Negative    50. Cat Hair 10,000 BAU/ml Negative    51.  Dog Epithelia Negative    52. Mixed Feathers Negative    53. Horse Epithelia Negative    54. Cockroach, German 2+    55. Tobacco Leaf 2+          Allergy testing results were read  and interpreted by provider, documented by clinical staff.   Assessment and plan:   Allergic rhinitis with conjunctivitis Chronic allergic rhinitis with year-round symptoms, worse during ragweed season.   - Use carbinoxamine  4mg  tabs take 2 tabs twice a day as needed. This is a prescription based antihistamine.   - Continue Singuliar 10mg  daily.  Best taken at bedtime.  - Use nasal Atrovent  2 sprays each nostril up to 3-4 times a day as needed for nasal drainage/congestion.   This can replace Astelin if more effective for drainage control.  Astelin can also be used in conjunction with Atrovent  if still having drainage as they work very differently for nasal symptom control.  Atrovent  is an anticholinergic medication and NOT a nasal steroid.  - Try to avoid benadryl as much as possible.   - Testing today showed: grasses, trees, outdoor molds, cockroach, and tobacco - Copy of test results provided.  - Avoidance measures provided. - Consider allergy shots as a means of long-term control. - Allergy shots re-train and reset the immune system to ignore environmental allergens and decrease the resulting immune response to those allergens (sneezing, itchy watery eyes, runny nose, nasal congestion, etc).    - Allergy shots improve symptoms in 75-85% of patients.  - If interested in started allergy shots then let me know and you can schedule a new start appointment.   Recurrent acute sinusitis Recurrent sinus infections 3-4 times per year, typically resolved with antibiotics. Discussed potential benefit of Atrovent  nasal spray for drainage and congestion control. - Recommended Atrovent  nasal spray for drainage and congestion control.  Atopic dermatitis (eczema) Intermittent eczema flares, currently well-controlled with moisturization and topical steroids. Discussed potential improvement of eczema with allergy shots if driven by environmental allergens. - Continue moisturization and use of  topical steroids for eczema flares.  Follow-up in 6 months or sooner if needed  I appreciate the opportunity to take part in Jacqueline Cuevas's care. Please do not hesitate to contact me with questions.  Sincerely,   Danita Brain, MD Allergy/Immunology [facility]      [1]  Allergies Allergen Reactions   Codeine    "
